# Patient Record
Sex: Male | Born: 1955 | Race: White | Hispanic: No | Marital: Married | Smoking: Former smoker
Health system: Southern US, Community
[De-identification: ages and names within clinical notes are randomized; demographics above are authoritative.]

---

## 2007-11-30 ENCOUNTER — Encounter: Admission: RE | Admit: 2007-11-30 | Discharge: 2007-11-30 | Payer: Self-pay | Admitting: Family Medicine

## 2010-04-15 ENCOUNTER — Ambulatory Visit (HOSPITAL_COMMUNITY): Admission: EM | Admit: 2010-04-15 | Discharge: 2010-04-15 | Payer: Self-pay | Admitting: *Deleted

## 2010-04-15 ENCOUNTER — Encounter: Payer: Self-pay | Admitting: Emergency Medicine

## 2010-10-09 LAB — POCT I-STAT, CHEM 8
BUN: 14 mg/dL (ref 6–23)
Creatinine, Ser: 1.2 mg/dL (ref 0.4–1.5)
Glucose, Bld: 130 mg/dL — ABNORMAL HIGH (ref 70–99)
Potassium: 4.3 mEq/L (ref 3.5–5.1)
Sodium: 139 mEq/L (ref 135–145)
TCO2: 19 mmol/L (ref 0–100)

## 2010-10-09 LAB — PROTIME-INR
INR: 1.04 (ref 0.00–1.49)
Prothrombin Time: 13.8 seconds (ref 11.6–15.2)

## 2012-05-13 IMAGING — CT CT HEAD W/O CM
3 of 4 series · 17 of 40 positions shown, 20 images · non-contrast
Comparison: None

CLINICAL DATA: Trauma.

CT HEAD WITHOUT CONTRAST
TECHNIQUE: Contiguous axial images were obtained from the base of
the skull through the vertex without contrast.

[Series 3: head_seq 4.5 h37s st · axial · 0.43mm/px · z∈[-104,-23]mm · 3 of 36 slices shown]
[im 9/36  brain]
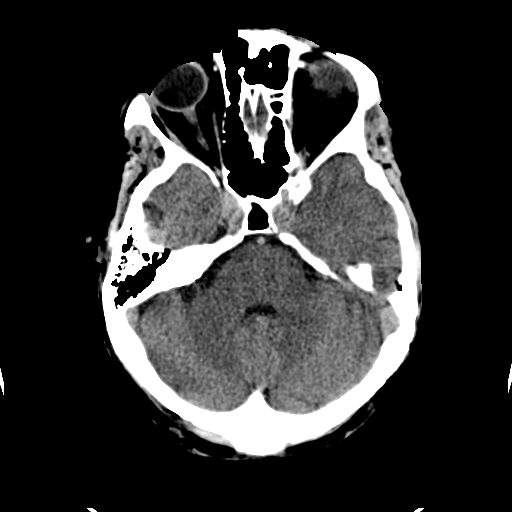
[im 18/36  brain]
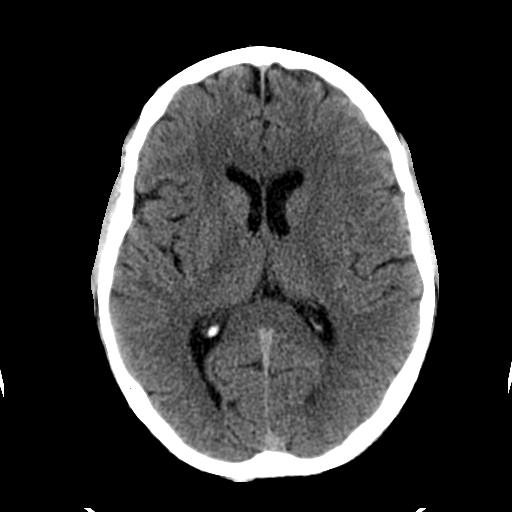
[im 27/36  brain]
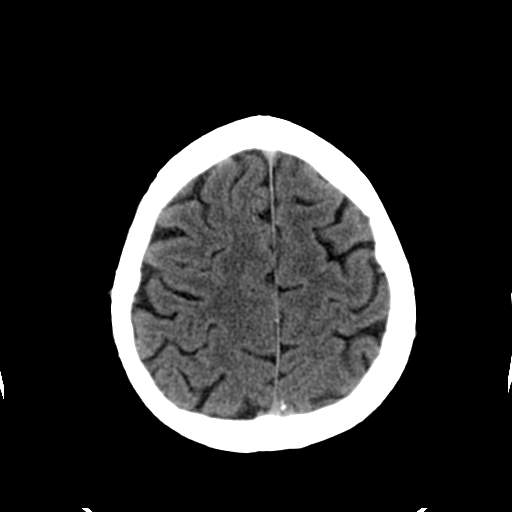

[Series 602: <mpr thick range> · coronal · 0.40mm/px · 3 of 49 slices shown]
[im 17/49  brain]
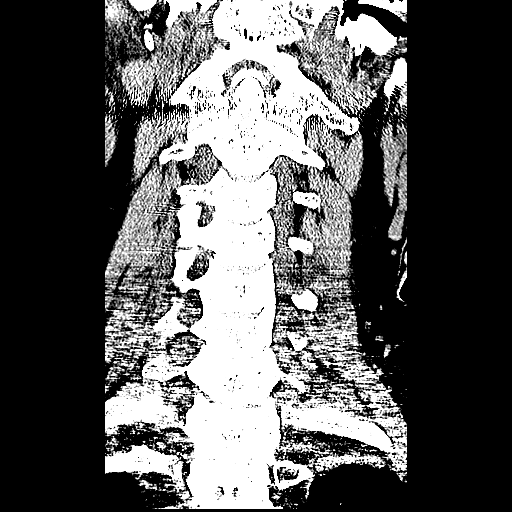
[im 22/49  brain]
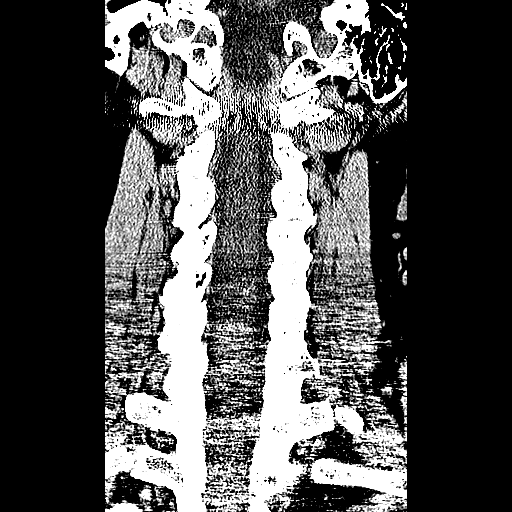
[im 27/49  brain]
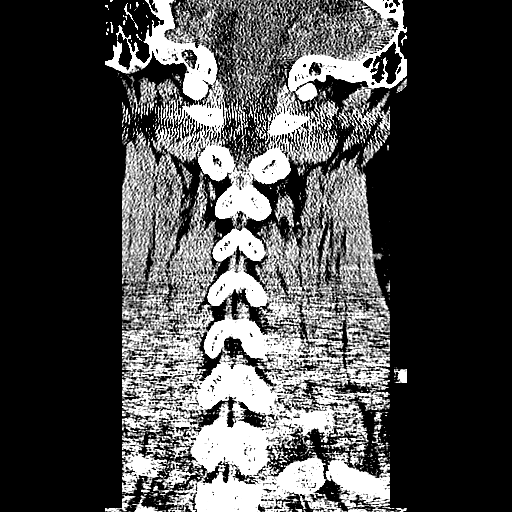

[Series 603: <mpr thick range(1)> · axial · 0.40mm/px · z∈[-349,-179]mm · 11 of 108 slices shown, 14 images]
[im 9/108  brain]
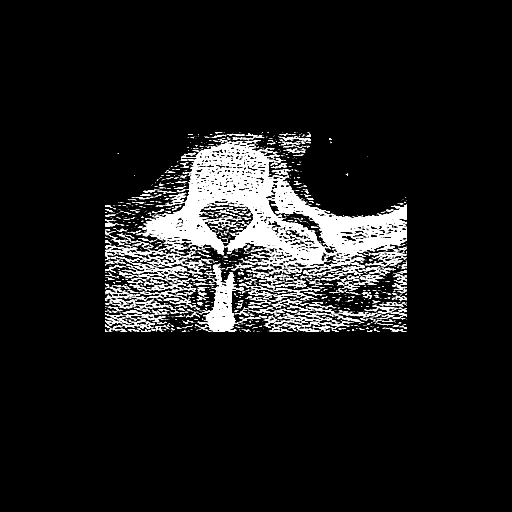
[im 9/108  bone]
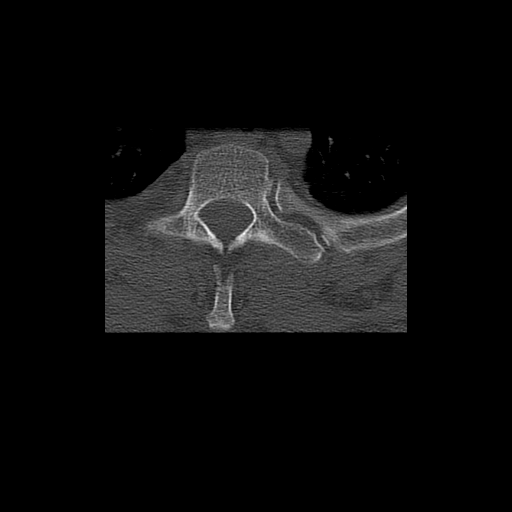
[im 18/108  brain]
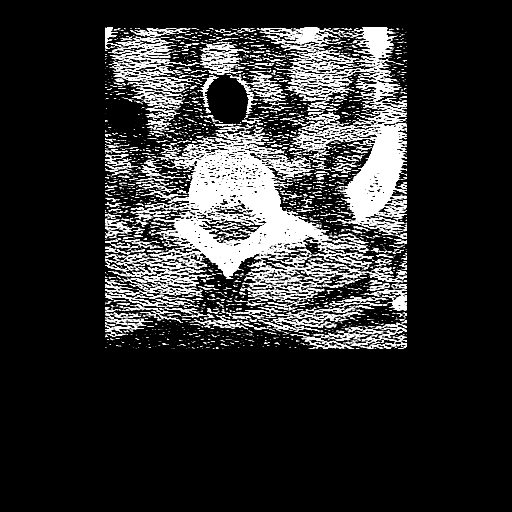
[im 27/108  brain]
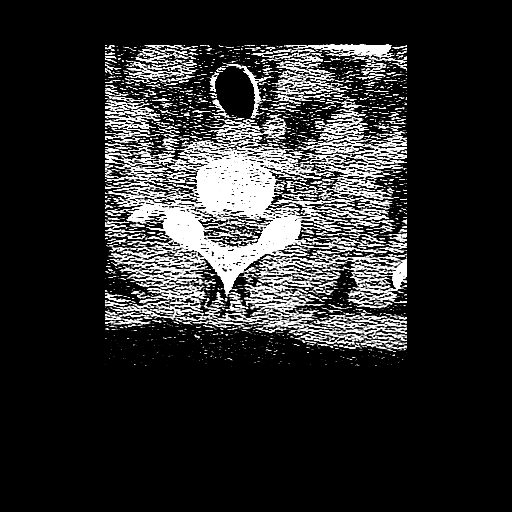
[im 36/108  brain]
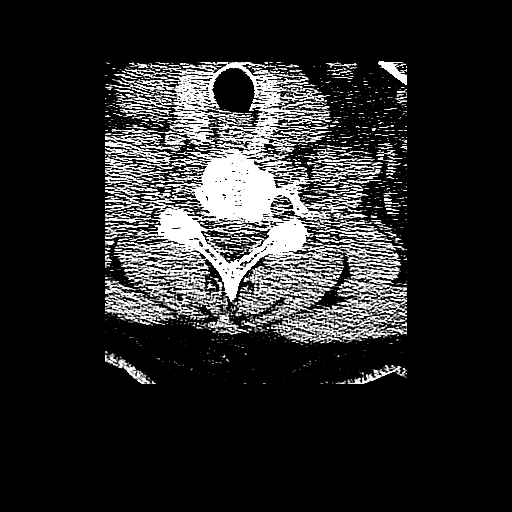
[im 45/108  brain]
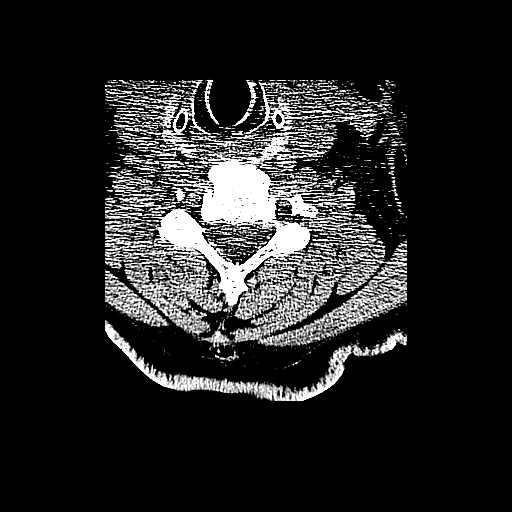
[im 45/108  bone]
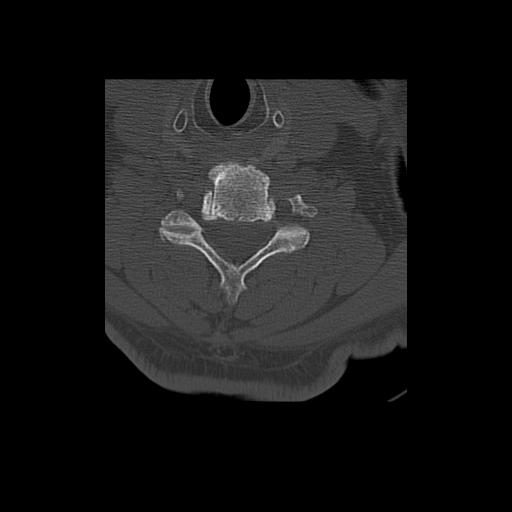
[im 54/108  brain]
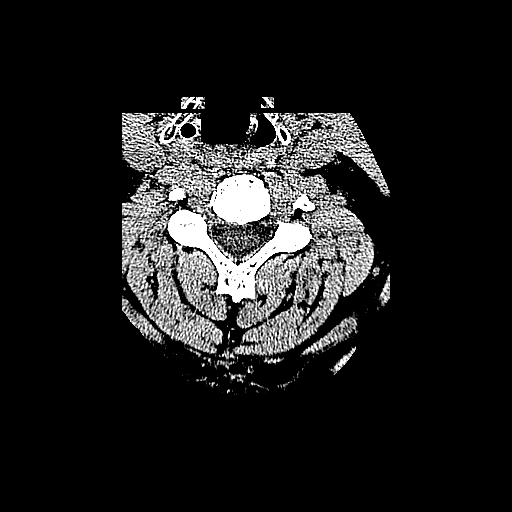
[im 63/108  brain]
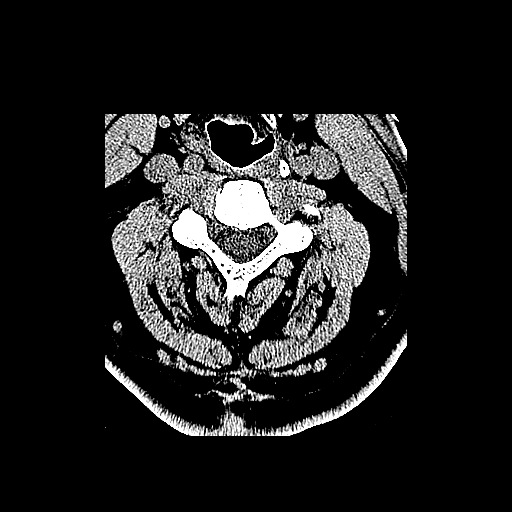
[im 72/108  brain]
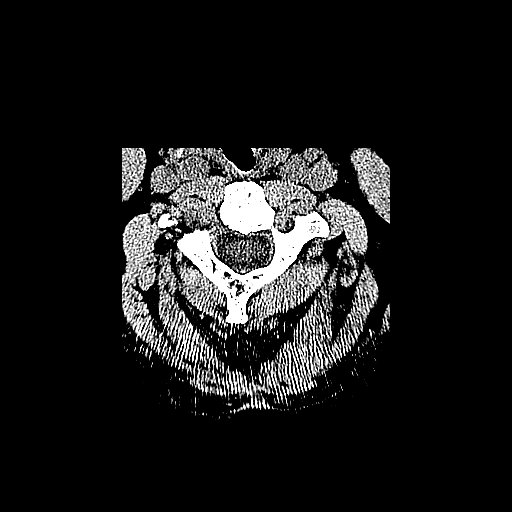
[im 81/108  brain]
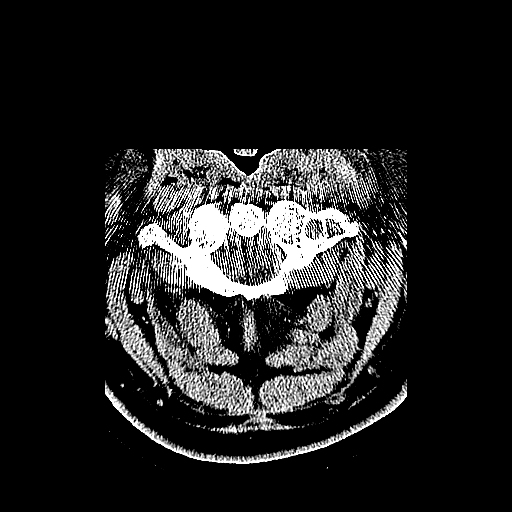
[im 81/108  bone]
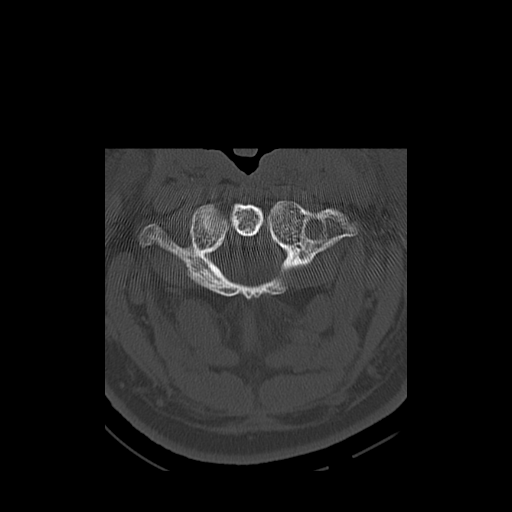
[im 90/108  brain]
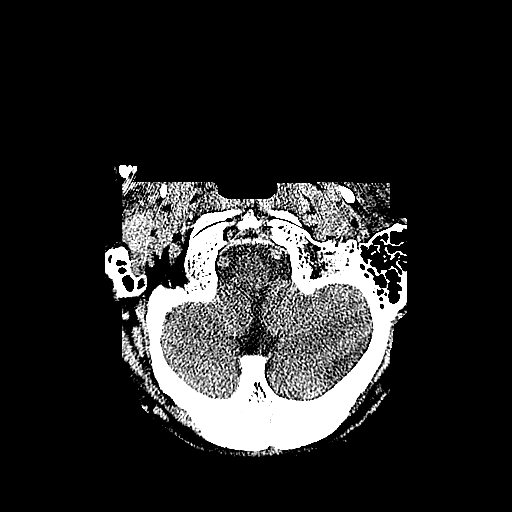
[im 99/108  brain]
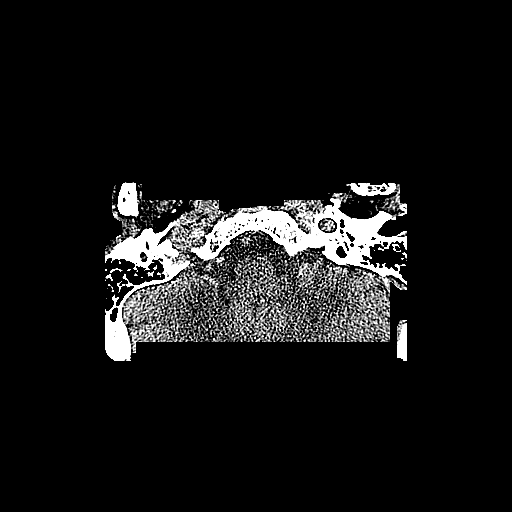

[17 of 40 positions shown; findings below may reference images not displayed]

FINDINGS: The ventricles are normal.  No extra-axial fluid
collections are seen.  The brainstem and cerebellum are
unremarkable.  No acute intracranial findings such as infarction or
hemorrhage.  No mass lesions.

The bony calvarium is intact.  The visualized paranasal sinuses and
mastoid air cells are clear.
IMPRESSION: No acute intracranial findings or skull fracture.

## 2012-05-13 IMAGING — CR DG FOREARM 2V*L*
2 series · 2 of 2 positions shown · non-contrast
Comparison: None

CLINICAL DATA: Fall.  Obvious deformity.  Pain.

LEFT FOREARM - 2 VIEW

[view not recorded (1 of 2)]
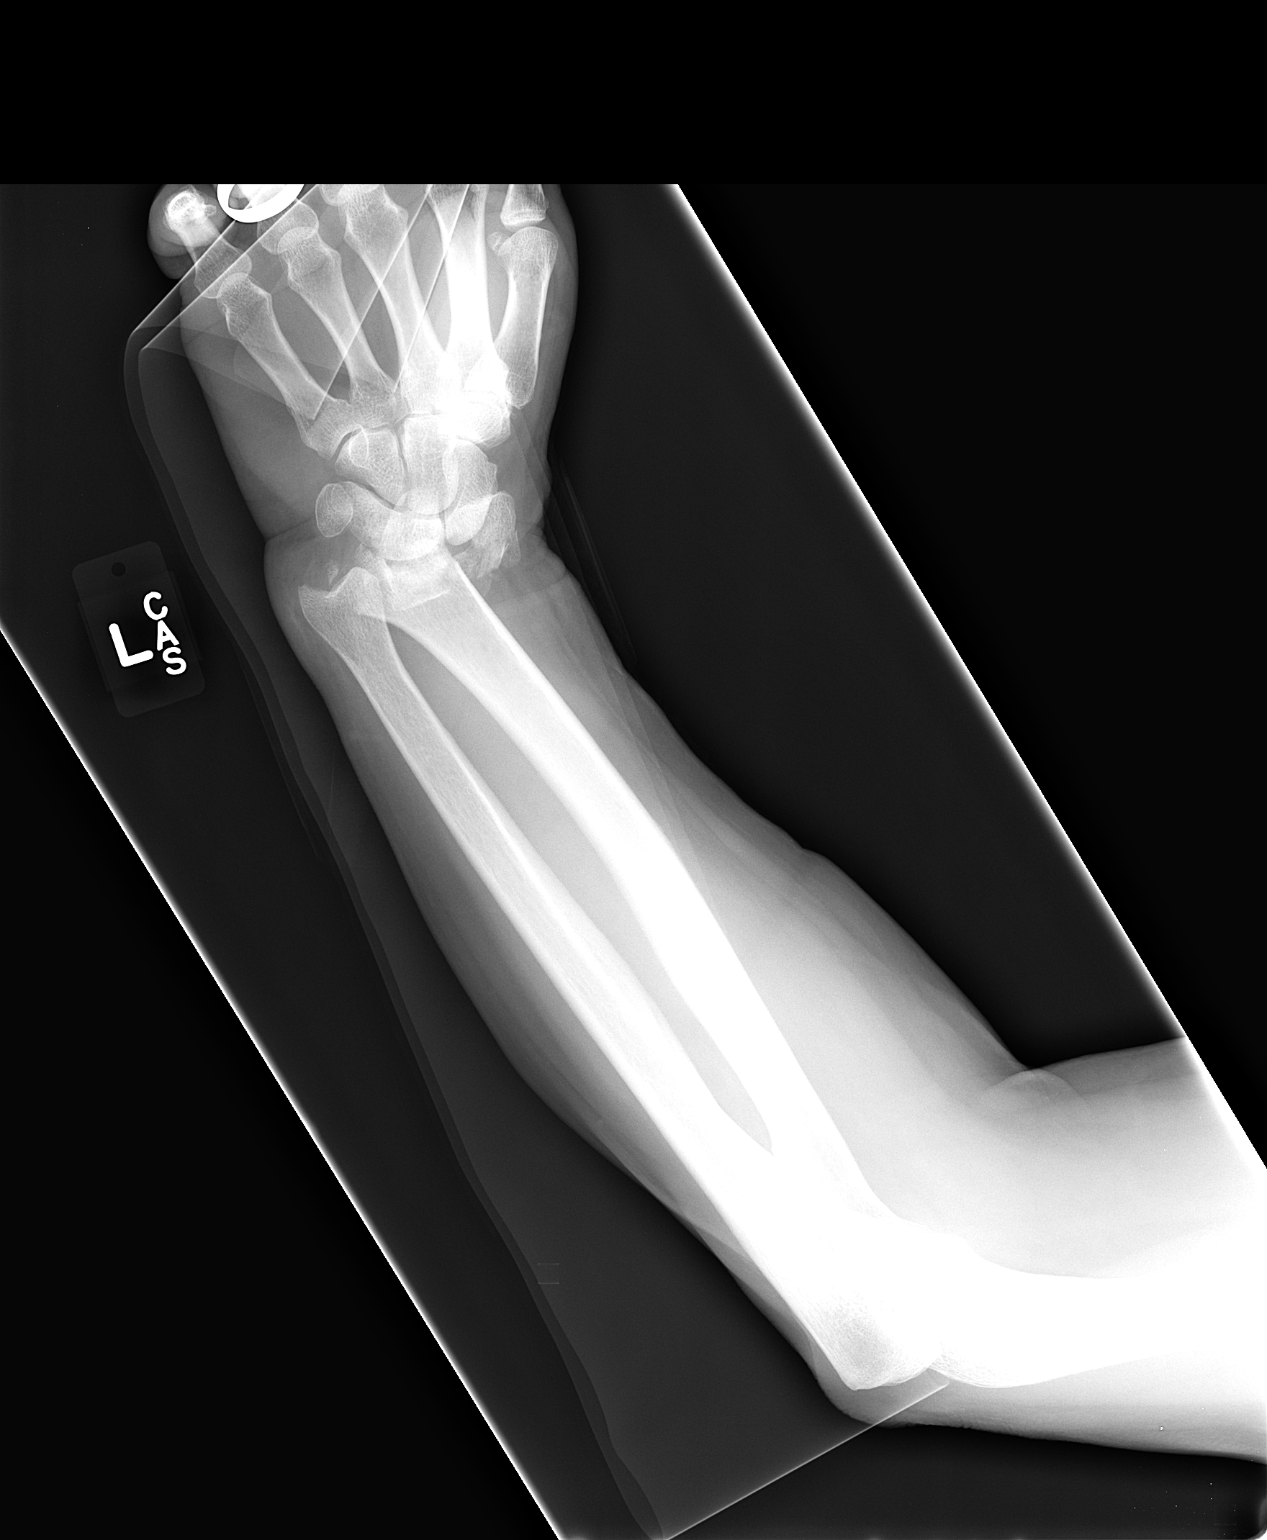

[view not recorded (2 of 2)]
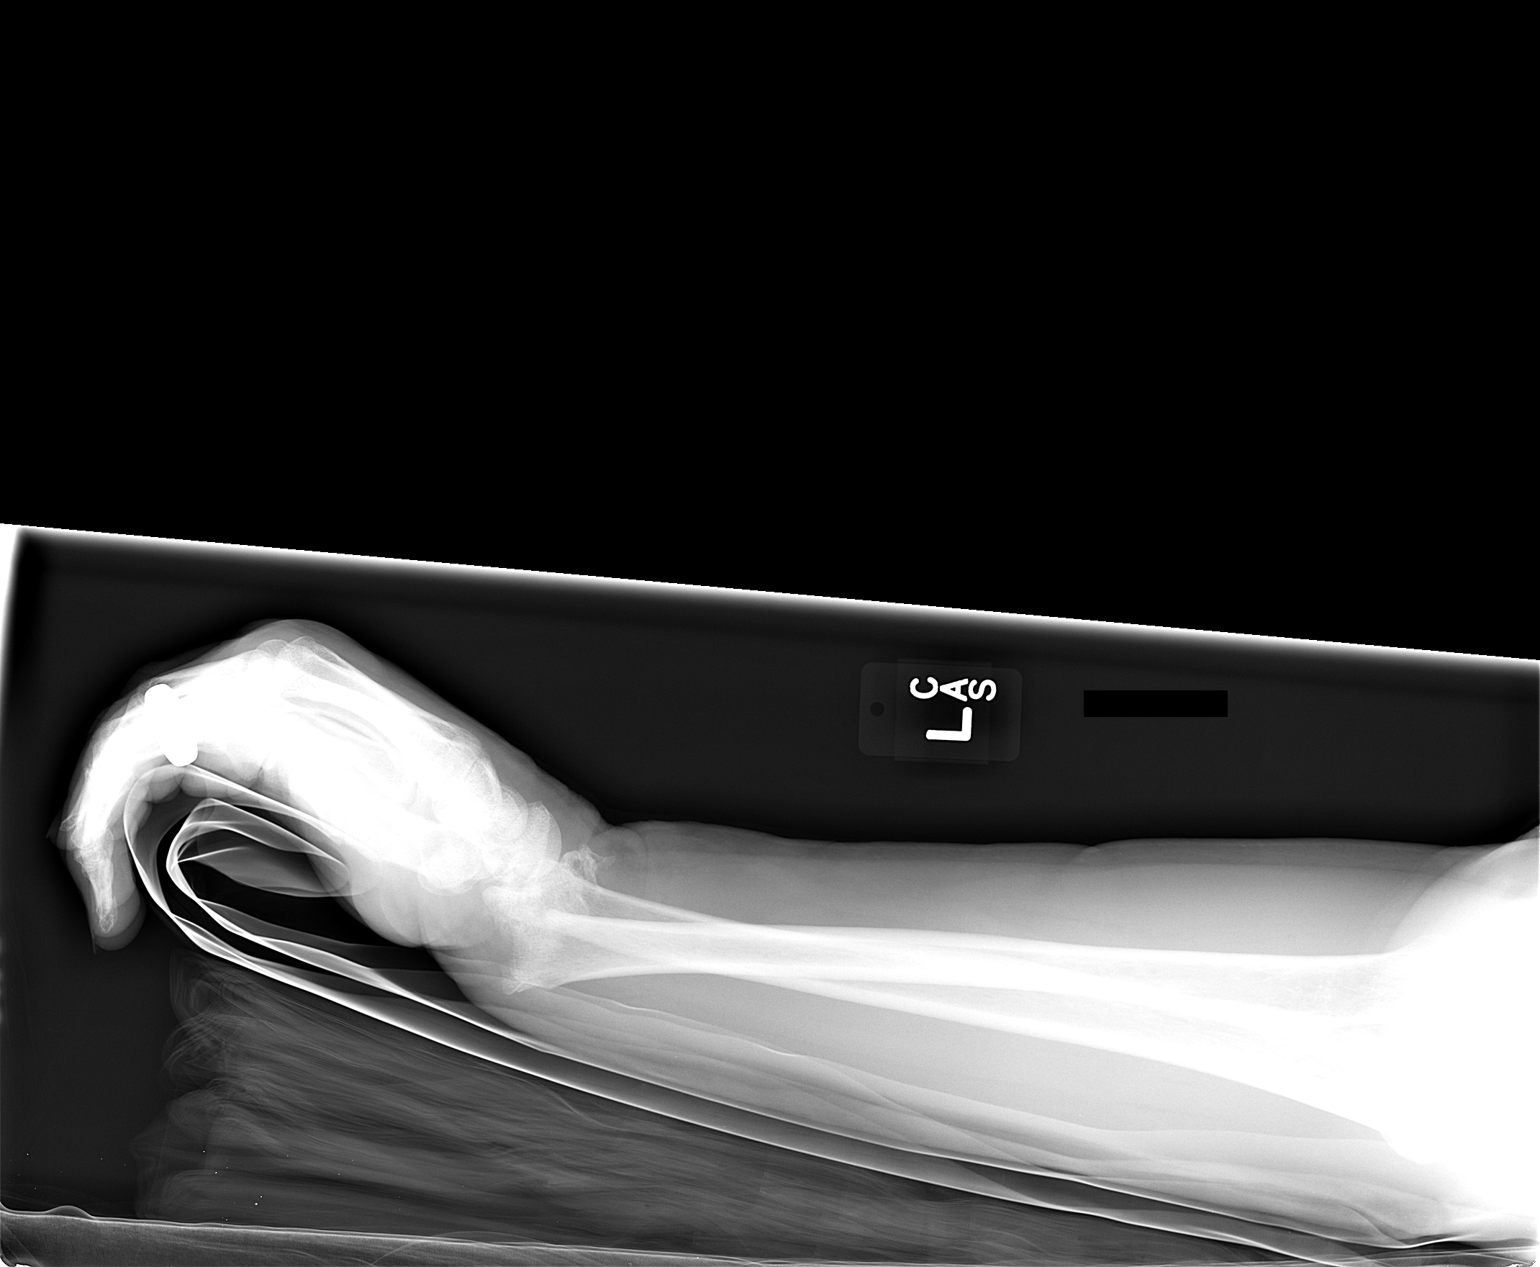

[2 of 2 positions shown; findings below may reference images not displayed]

FINDINGS: Two views are performed, showing comminuted intra-
articular fracture of the distal radius.  There is dorsal and
radial displacement and angulation at the fracture site.  There is
a fracture of the ulnar styloid.  There is probable dislocation of
the radiocarpal joint.  Limited evaluation of the carpus normal
intercarpal spaces.  There is soft tissue edema.  No radiopaque
foreign body or soft tissue gas identified.
IMPRESSION: Comminuted fracture dislocation of the radius and ulna.

The findings were discussed with Dr. Camargo on 04/15/2010 at [DATE]
p.m.

## 2014-11-01 ENCOUNTER — Ambulatory Visit
Admission: RE | Admit: 2014-11-01 | Discharge: 2014-11-01 | Disposition: A | Discharge status: Home / Self Care | Payer: Federal, State, Local not specified - PPO | Source: Ambulatory Visit | Attending: Family Medicine | Admitting: Family Medicine

## 2014-11-01 ENCOUNTER — Other Ambulatory Visit: Payer: Self-pay | Admitting: Family Medicine

## 2014-11-01 DIAGNOSIS — M25552 Pain in left hip: Secondary | ICD-10-CM

## 2015-11-07 DIAGNOSIS — F329 Major depressive disorder, single episode, unspecified: Secondary | ICD-10-CM | POA: Diagnosis not present

## 2015-11-07 DIAGNOSIS — E7800 Pure hypercholesterolemia, unspecified: Secondary | ICD-10-CM | POA: Diagnosis not present

## 2015-11-07 DIAGNOSIS — E669 Obesity, unspecified: Secondary | ICD-10-CM | POA: Diagnosis not present

## 2015-11-07 DIAGNOSIS — I10 Essential (primary) hypertension: Secondary | ICD-10-CM | POA: Diagnosis not present

## 2016-05-13 DIAGNOSIS — Z0000 Encounter for general adult medical examination without abnormal findings: Secondary | ICD-10-CM | POA: Diagnosis not present

## 2016-05-13 DIAGNOSIS — Z125 Encounter for screening for malignant neoplasm of prostate: Secondary | ICD-10-CM | POA: Diagnosis not present

## 2016-05-13 DIAGNOSIS — Z1159 Encounter for screening for other viral diseases: Secondary | ICD-10-CM | POA: Diagnosis not present

## 2016-05-13 DIAGNOSIS — Z23 Encounter for immunization: Secondary | ICD-10-CM | POA: Diagnosis not present

## 2016-05-13 DIAGNOSIS — I10 Essential (primary) hypertension: Secondary | ICD-10-CM | POA: Diagnosis not present

## 2016-05-13 DIAGNOSIS — E7800 Pure hypercholesterolemia, unspecified: Secondary | ICD-10-CM | POA: Diagnosis not present

## 2016-09-02 DIAGNOSIS — K080 Exfoliation of teeth due to systemic causes: Secondary | ICD-10-CM | POA: Diagnosis not present

## 2016-11-11 DIAGNOSIS — E7800 Pure hypercholesterolemia, unspecified: Secondary | ICD-10-CM | POA: Diagnosis not present

## 2016-11-11 DIAGNOSIS — I10 Essential (primary) hypertension: Secondary | ICD-10-CM | POA: Diagnosis not present

## 2016-11-11 DIAGNOSIS — F329 Major depressive disorder, single episode, unspecified: Secondary | ICD-10-CM | POA: Diagnosis not present

## 2016-11-29 IMAGING — CR DG HIP (WITH OR WITHOUT PELVIS) 2-3V*L*
2 series · 2 of 2 positions shown · non-contrast
Comparison: Pelvic radiograph 04/15/2010.

CLINICAL DATA: Left hip pain with standing for 2 months. No known
injury. Initial encounter.

EXAM:
LEFT HIP (WITH PELVIS) 2-3 VIEWS

[w pelvis]
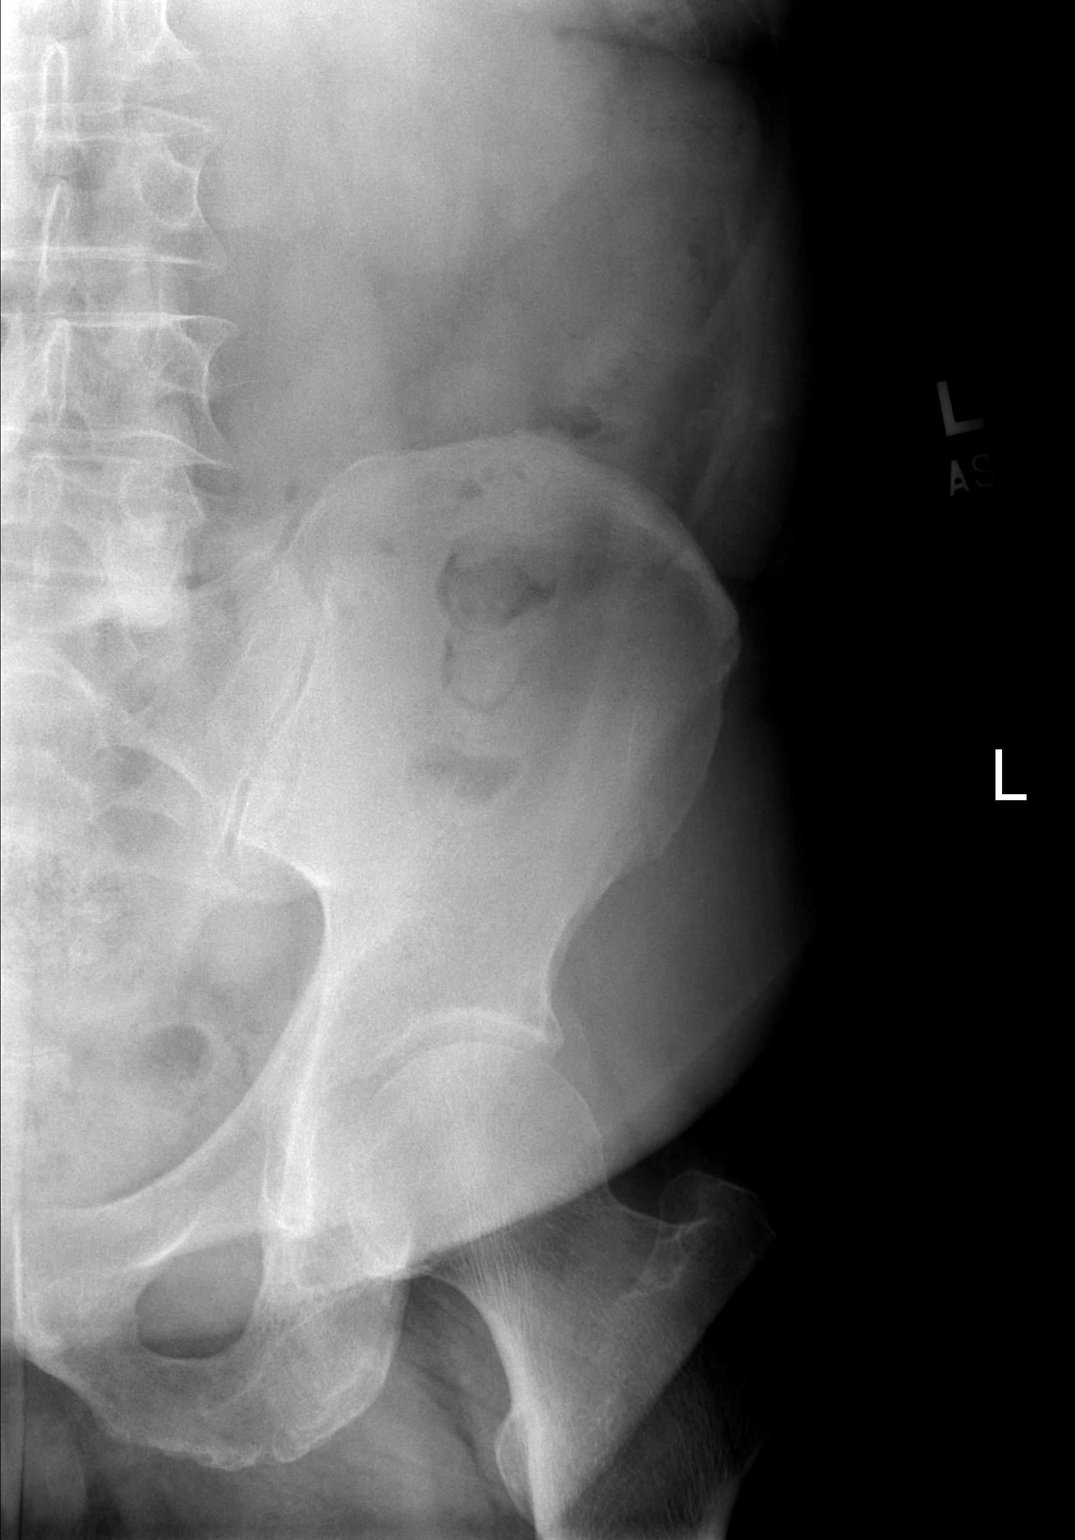

[w pelvis *]
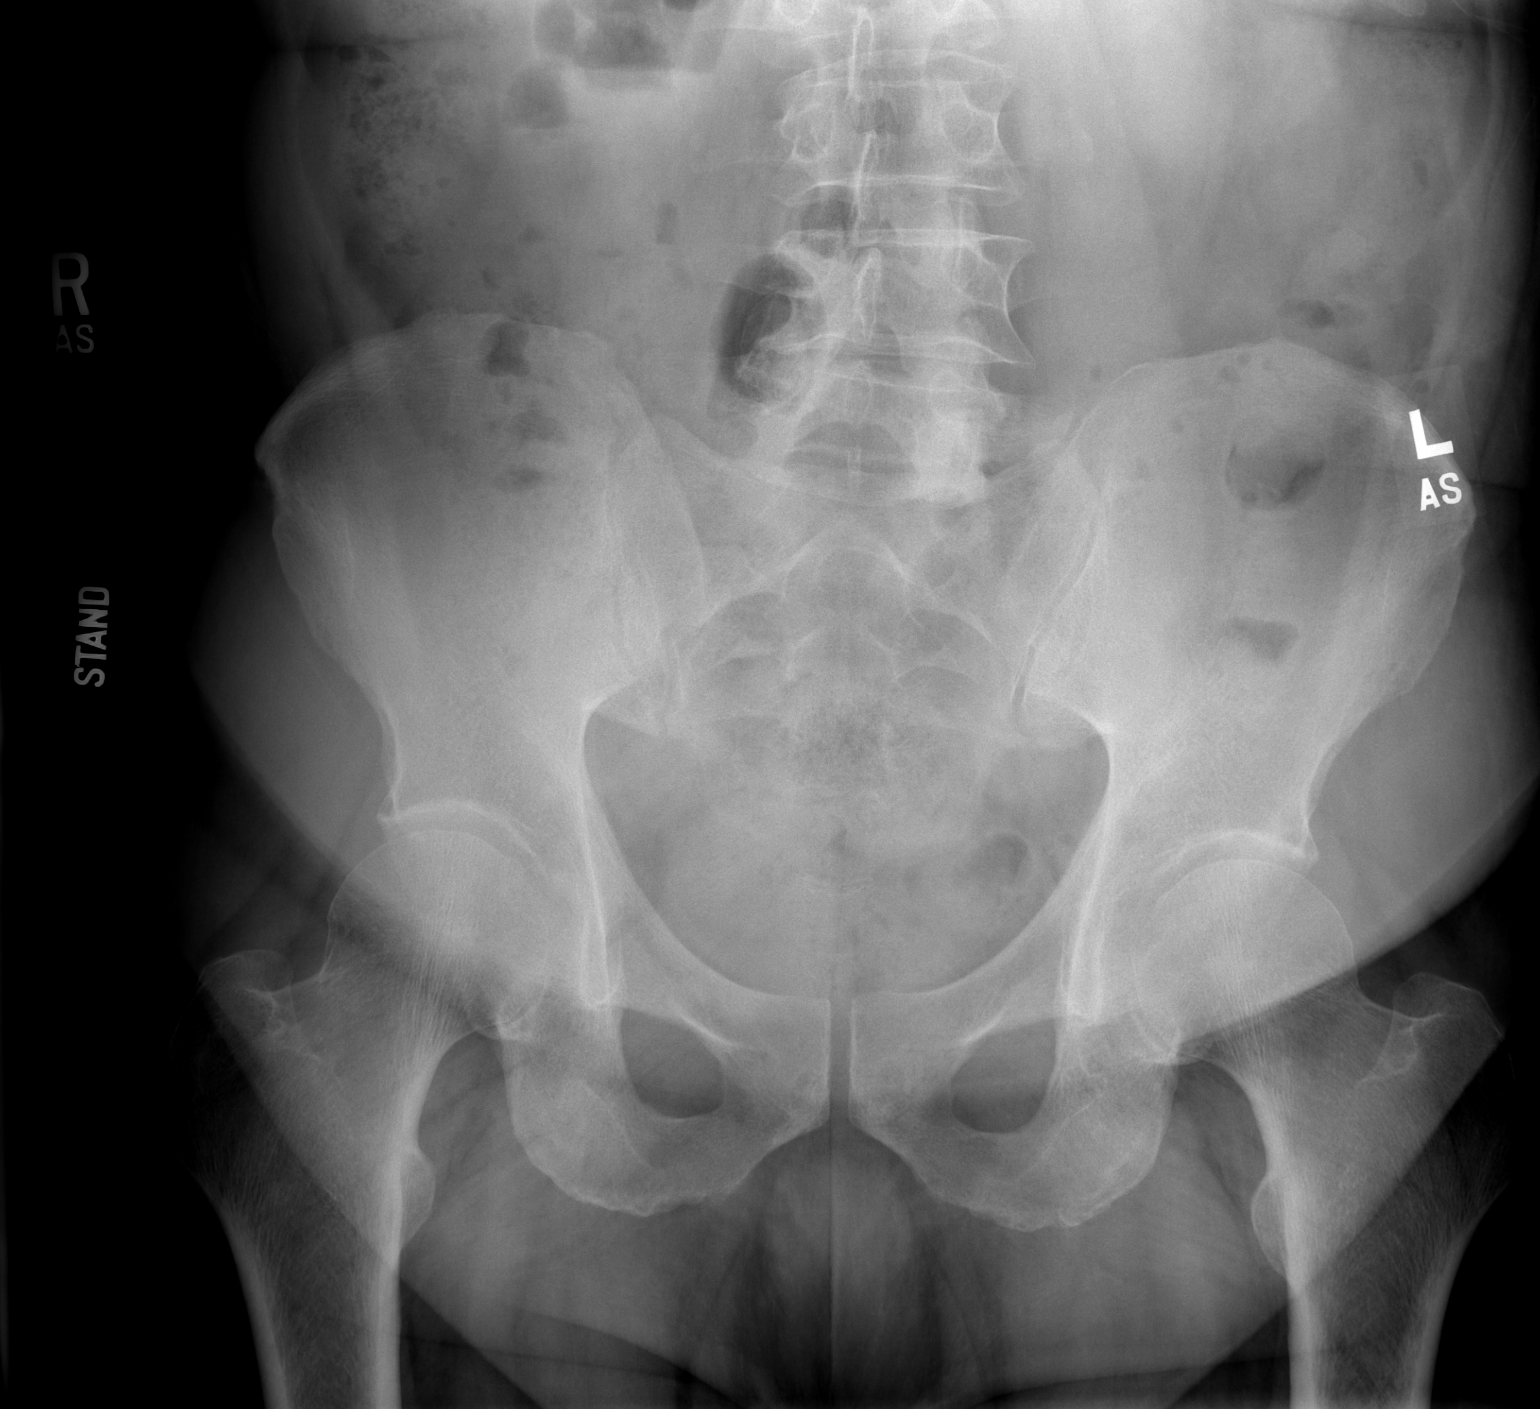

[2 of 2 positions shown; findings below may reference images not displayed]

FINDINGS: The mineralization and alignment are normal. There is no evidence of
acute fracture or dislocation. There is no evidence of femoral head
avascular necrosis. The hip joint spaces are preserved. The
sacroiliac joints appear normal. There is mild disc space loss in
the lower lumbar spine.
IMPRESSION: No acute findings or significant arthropathic changes in the pelvis
or hips. Lower lumbar spondylosis.

## 2017-03-10 DIAGNOSIS — K080 Exfoliation of teeth due to systemic causes: Secondary | ICD-10-CM | POA: Diagnosis not present

## 2017-05-20 DIAGNOSIS — I10 Essential (primary) hypertension: Secondary | ICD-10-CM | POA: Diagnosis not present

## 2017-05-20 DIAGNOSIS — Z0000 Encounter for general adult medical examination without abnormal findings: Secondary | ICD-10-CM | POA: Diagnosis not present

## 2017-05-20 DIAGNOSIS — E7800 Pure hypercholesterolemia, unspecified: Secondary | ICD-10-CM | POA: Diagnosis not present

## 2017-05-20 DIAGNOSIS — F329 Major depressive disorder, single episode, unspecified: Secondary | ICD-10-CM | POA: Diagnosis not present

## 2017-05-20 DIAGNOSIS — Z23 Encounter for immunization: Secondary | ICD-10-CM | POA: Diagnosis not present

## 2017-07-06 DIAGNOSIS — Z0189 Encounter for other specified special examinations: Secondary | ICD-10-CM | POA: Diagnosis not present

## 2017-07-06 DIAGNOSIS — I10 Essential (primary) hypertension: Secondary | ICD-10-CM | POA: Diagnosis not present

## 2017-09-16 DIAGNOSIS — K080 Exfoliation of teeth due to systemic causes: Secondary | ICD-10-CM | POA: Diagnosis not present

## 2017-12-02 DIAGNOSIS — I10 Essential (primary) hypertension: Secondary | ICD-10-CM | POA: Diagnosis not present

## 2017-12-02 DIAGNOSIS — F329 Major depressive disorder, single episode, unspecified: Secondary | ICD-10-CM | POA: Diagnosis not present

## 2017-12-02 DIAGNOSIS — E7800 Pure hypercholesterolemia, unspecified: Secondary | ICD-10-CM | POA: Diagnosis not present

## 2018-05-17 DIAGNOSIS — K080 Exfoliation of teeth due to systemic causes: Secondary | ICD-10-CM | POA: Diagnosis not present

## 2018-06-01 DIAGNOSIS — F329 Major depressive disorder, single episode, unspecified: Secondary | ICD-10-CM | POA: Diagnosis not present

## 2018-06-01 DIAGNOSIS — Z23 Encounter for immunization: Secondary | ICD-10-CM | POA: Diagnosis not present

## 2018-06-01 DIAGNOSIS — E7800 Pure hypercholesterolemia, unspecified: Secondary | ICD-10-CM | POA: Diagnosis not present

## 2018-06-01 DIAGNOSIS — Z125 Encounter for screening for malignant neoplasm of prostate: Secondary | ICD-10-CM | POA: Diagnosis not present

## 2018-06-01 DIAGNOSIS — Z0000 Encounter for general adult medical examination without abnormal findings: Secondary | ICD-10-CM | POA: Diagnosis not present

## 2018-06-01 DIAGNOSIS — I10 Essential (primary) hypertension: Secondary | ICD-10-CM | POA: Diagnosis not present

## 2018-07-06 DIAGNOSIS — M545 Low back pain: Secondary | ICD-10-CM | POA: Diagnosis not present

## 2018-07-06 DIAGNOSIS — I10 Essential (primary) hypertension: Secondary | ICD-10-CM | POA: Diagnosis not present

## 2018-07-06 DIAGNOSIS — G8929 Other chronic pain: Secondary | ICD-10-CM | POA: Diagnosis not present

## 2018-07-06 DIAGNOSIS — Z0000 Encounter for general adult medical examination without abnormal findings: Secondary | ICD-10-CM | POA: Diagnosis not present

## 2018-07-07 DIAGNOSIS — I10 Essential (primary) hypertension: Secondary | ICD-10-CM | POA: Diagnosis not present

## 2018-07-07 DIAGNOSIS — M25511 Pain in right shoulder: Secondary | ICD-10-CM | POA: Diagnosis not present

## 2018-08-25 DIAGNOSIS — Z23 Encounter for immunization: Secondary | ICD-10-CM | POA: Diagnosis not present

## 2018-09-14 DIAGNOSIS — M25511 Pain in right shoulder: Secondary | ICD-10-CM | POA: Diagnosis not present

## 2018-12-13 DIAGNOSIS — I10 Essential (primary) hypertension: Secondary | ICD-10-CM | POA: Diagnosis not present

## 2018-12-13 DIAGNOSIS — E7800 Pure hypercholesterolemia, unspecified: Secondary | ICD-10-CM | POA: Diagnosis not present

## 2018-12-13 DIAGNOSIS — E669 Obesity, unspecified: Secondary | ICD-10-CM | POA: Diagnosis not present

## 2018-12-13 DIAGNOSIS — F329 Major depressive disorder, single episode, unspecified: Secondary | ICD-10-CM | POA: Diagnosis not present

## 2019-01-12 DIAGNOSIS — K080 Exfoliation of teeth due to systemic causes: Secondary | ICD-10-CM | POA: Diagnosis not present

## 2019-02-22 DIAGNOSIS — K080 Exfoliation of teeth due to systemic causes: Secondary | ICD-10-CM | POA: Diagnosis not present

## 2019-03-02 DIAGNOSIS — K080 Exfoliation of teeth due to systemic causes: Secondary | ICD-10-CM | POA: Diagnosis not present

## 2019-03-14 DIAGNOSIS — K080 Exfoliation of teeth due to systemic causes: Secondary | ICD-10-CM | POA: Diagnosis not present

## 2019-05-08 DIAGNOSIS — K080 Exfoliation of teeth due to systemic causes: Secondary | ICD-10-CM | POA: Diagnosis not present

## 2019-06-02 DIAGNOSIS — Z23 Encounter for immunization: Secondary | ICD-10-CM | POA: Diagnosis not present

## 2019-06-28 DIAGNOSIS — E7800 Pure hypercholesterolemia, unspecified: Secondary | ICD-10-CM | POA: Diagnosis not present

## 2019-06-28 DIAGNOSIS — F329 Major depressive disorder, single episode, unspecified: Secondary | ICD-10-CM | POA: Diagnosis not present

## 2019-06-28 DIAGNOSIS — Z0000 Encounter for general adult medical examination without abnormal findings: Secondary | ICD-10-CM | POA: Diagnosis not present

## 2019-06-28 DIAGNOSIS — I10 Essential (primary) hypertension: Secondary | ICD-10-CM | POA: Diagnosis not present

## 2019-07-06 DIAGNOSIS — M545 Low back pain: Secondary | ICD-10-CM | POA: Diagnosis not present

## 2019-07-06 DIAGNOSIS — M25511 Pain in right shoulder: Secondary | ICD-10-CM | POA: Diagnosis not present

## 2019-07-06 DIAGNOSIS — I10 Essential (primary) hypertension: Secondary | ICD-10-CM | POA: Diagnosis not present

## 2019-07-06 DIAGNOSIS — E7800 Pure hypercholesterolemia, unspecified: Secondary | ICD-10-CM | POA: Diagnosis not present

## 2019-07-06 DIAGNOSIS — L989 Disorder of the skin and subcutaneous tissue, unspecified: Secondary | ICD-10-CM | POA: Diagnosis not present

## 2019-08-10 DIAGNOSIS — D229 Melanocytic nevi, unspecified: Secondary | ICD-10-CM | POA: Diagnosis not present

## 2019-08-10 DIAGNOSIS — L821 Other seborrheic keratosis: Secondary | ICD-10-CM | POA: Diagnosis not present

## 2019-08-10 DIAGNOSIS — L570 Actinic keratosis: Secondary | ICD-10-CM | POA: Diagnosis not present

## 2019-10-05 DIAGNOSIS — Z23 Encounter for immunization: Secondary | ICD-10-CM | POA: Diagnosis not present

## 2019-10-06 DIAGNOSIS — S51851A Open bite of right forearm, initial encounter: Secondary | ICD-10-CM | POA: Diagnosis not present

## 2019-10-06 DIAGNOSIS — W540XXA Bitten by dog, initial encounter: Secondary | ICD-10-CM | POA: Diagnosis not present

## 2020-01-05 DIAGNOSIS — I10 Essential (primary) hypertension: Secondary | ICD-10-CM | POA: Diagnosis not present

## 2020-01-05 DIAGNOSIS — E7800 Pure hypercholesterolemia, unspecified: Secondary | ICD-10-CM | POA: Diagnosis not present

## 2020-01-05 DIAGNOSIS — F329 Major depressive disorder, single episode, unspecified: Secondary | ICD-10-CM | POA: Diagnosis not present

## 2020-07-04 DIAGNOSIS — G8929 Other chronic pain: Secondary | ICD-10-CM | POA: Diagnosis not present

## 2020-07-04 DIAGNOSIS — M545 Low back pain, unspecified: Secondary | ICD-10-CM | POA: Diagnosis not present

## 2020-07-04 DIAGNOSIS — I10 Essential (primary) hypertension: Secondary | ICD-10-CM | POA: Diagnosis not present

## 2020-07-05 DIAGNOSIS — I10 Essential (primary) hypertension: Secondary | ICD-10-CM | POA: Diagnosis not present

## 2020-07-05 DIAGNOSIS — Z0000 Encounter for general adult medical examination without abnormal findings: Secondary | ICD-10-CM | POA: Diagnosis not present

## 2020-07-05 DIAGNOSIS — Z125 Encounter for screening for malignant neoplasm of prostate: Secondary | ICD-10-CM | POA: Diagnosis not present

## 2020-07-05 DIAGNOSIS — E7800 Pure hypercholesterolemia, unspecified: Secondary | ICD-10-CM | POA: Diagnosis not present

## 2020-07-05 DIAGNOSIS — F329 Major depressive disorder, single episode, unspecified: Secondary | ICD-10-CM | POA: Diagnosis not present

## 2020-08-09 DIAGNOSIS — Z872 Personal history of diseases of the skin and subcutaneous tissue: Secondary | ICD-10-CM | POA: Diagnosis not present

## 2020-08-09 DIAGNOSIS — D225 Melanocytic nevi of trunk: Secondary | ICD-10-CM | POA: Diagnosis not present

## 2020-08-09 DIAGNOSIS — L821 Other seborrheic keratosis: Secondary | ICD-10-CM | POA: Diagnosis not present

## 2020-08-09 DIAGNOSIS — Z09 Encounter for follow-up examination after completed treatment for conditions other than malignant neoplasm: Secondary | ICD-10-CM | POA: Diagnosis not present

## 2021-03-20 DIAGNOSIS — Z23 Encounter for immunization: Secondary | ICD-10-CM | POA: Diagnosis not present

## 2021-03-20 DIAGNOSIS — E7800 Pure hypercholesterolemia, unspecified: Secondary | ICD-10-CM | POA: Diagnosis not present

## 2021-03-20 DIAGNOSIS — I10 Essential (primary) hypertension: Secondary | ICD-10-CM | POA: Diagnosis not present

## 2021-04-02 DIAGNOSIS — U071 COVID-19: Secondary | ICD-10-CM | POA: Diagnosis not present

## 2021-07-02 DIAGNOSIS — Z03818 Encounter for observation for suspected exposure to other biological agents ruled out: Secondary | ICD-10-CM | POA: Diagnosis not present

## 2021-07-02 DIAGNOSIS — J101 Influenza due to other identified influenza virus with other respiratory manifestations: Secondary | ICD-10-CM | POA: Diagnosis not present

## 2021-07-02 DIAGNOSIS — B349 Viral infection, unspecified: Secondary | ICD-10-CM | POA: Diagnosis not present

## 2021-07-10 DIAGNOSIS — Z125 Encounter for screening for malignant neoplasm of prostate: Secondary | ICD-10-CM | POA: Diagnosis not present

## 2021-07-10 DIAGNOSIS — I10 Essential (primary) hypertension: Secondary | ICD-10-CM | POA: Diagnosis not present

## 2021-07-10 DIAGNOSIS — E7800 Pure hypercholesterolemia, unspecified: Secondary | ICD-10-CM | POA: Diagnosis not present

## 2021-07-10 DIAGNOSIS — Z0000 Encounter for general adult medical examination without abnormal findings: Secondary | ICD-10-CM | POA: Diagnosis not present

## 2021-07-10 DIAGNOSIS — F329 Major depressive disorder, single episode, unspecified: Secondary | ICD-10-CM | POA: Diagnosis not present

## 2021-07-11 DIAGNOSIS — Z87891 Personal history of nicotine dependence: Secondary | ICD-10-CM | POA: Diagnosis not present

## 2021-07-11 DIAGNOSIS — I10 Essential (primary) hypertension: Secondary | ICD-10-CM | POA: Diagnosis not present

## 2021-07-11 DIAGNOSIS — Z125 Encounter for screening for malignant neoplasm of prostate: Secondary | ICD-10-CM | POA: Diagnosis not present

## 2021-07-11 DIAGNOSIS — M779 Enthesopathy, unspecified: Secondary | ICD-10-CM | POA: Diagnosis not present

## 2021-09-10 DIAGNOSIS — K080 Exfoliation of teeth due to systemic causes: Secondary | ICD-10-CM | POA: Diagnosis not present

## 2022-01-14 DIAGNOSIS — I10 Essential (primary) hypertension: Secondary | ICD-10-CM | POA: Diagnosis not present

## 2022-01-14 DIAGNOSIS — E7800 Pure hypercholesterolemia, unspecified: Secondary | ICD-10-CM | POA: Diagnosis not present

## 2022-01-14 DIAGNOSIS — E669 Obesity, unspecified: Secondary | ICD-10-CM | POA: Diagnosis not present

## 2022-01-15 ENCOUNTER — Other Ambulatory Visit: Payer: Self-pay | Admitting: Family Medicine

## 2022-01-15 ENCOUNTER — Other Ambulatory Visit (HOSPITAL_COMMUNITY): Payer: Self-pay | Admitting: Family Medicine

## 2022-01-15 DIAGNOSIS — E7800 Pure hypercholesterolemia, unspecified: Secondary | ICD-10-CM

## 2022-02-18 ENCOUNTER — Ambulatory Visit (HOSPITAL_BASED_OUTPATIENT_CLINIC_OR_DEPARTMENT_OTHER)
Admission: RE | Admit: 2022-02-18 | Discharge: 2022-02-18 | Disposition: A | Discharge status: Home / Self Care | Payer: Federal, State, Local not specified - PPO | Source: Ambulatory Visit | Attending: Family Medicine | Admitting: Family Medicine

## 2022-02-18 DIAGNOSIS — E7800 Pure hypercholesterolemia, unspecified: Secondary | ICD-10-CM | POA: Insufficient documentation

## 2022-04-16 ENCOUNTER — Encounter: Payer: Self-pay | Admitting: Interventional Cardiology

## 2022-04-16 ENCOUNTER — Ambulatory Visit
Payer: Federal, State, Local not specified - PPO | Attending: Interventional Cardiology | Admitting: Interventional Cardiology

## 2022-04-16 VITALS — BP 112/80 | HR 65 | Ht 70.0 in | Wt 213.0 lb

## 2022-04-16 DIAGNOSIS — I10 Essential (primary) hypertension: Secondary | ICD-10-CM | POA: Diagnosis not present

## 2022-04-16 DIAGNOSIS — E785 Hyperlipidemia, unspecified: Secondary | ICD-10-CM | POA: Diagnosis not present

## 2022-04-16 NOTE — Progress Notes (Signed)
Cardiology Office Note   Date:  04/16/2022   ID:  Ricky Brown, DOB [birth year], MRN [id number]  PCP:  Glenis Smoker, MD    No chief complaint on file.  Coronary artery calcification  Wt Readings from Last 3 Encounters:  04/16/22 213 lb (96.6 kg)       History of Present Illness: Ricky Brown is a 66 y.o. male who is being seen today for the evaluation of coronary artery calcification at the request of Glenis Smoker, *.   Calcium scoring CT in July 2023 showed: "Coronary Calcium Score:   Left main: 0   Left anterior descending artery: 259   Left circumflex artery: 0   Right coronary artery: 0   Total: 259   Percentile: 71   Pericardium: Normal.   Ascending Aorta: Normal caliber."  He walks and hikes.  He wants to avoid statins.   Has felt well doing strenuous hikes.    Denies : Chest pain. Dizziness. Leg edema. Nitroglycerin use. Orthopnea. Palpitations. Paroxysmal nocturnal dyspnea. Shortness of breath. Syncope.    No past medical history on file.     Current Outpatient Medications  Medication Sig Dispense Refill   amLODipine (NORVASC) 10 MG tablet Take 0.5 tablets by mouth daily.     citalopram (CELEXA) 40 MG tablet Take 40 mg by mouth daily.     lisinopril (ZESTRIL) 20 MG tablet Take 20 mg by mouth daily.     No current facility-administered medications for this visit.    Allergies:   Patient has no allergy information on record.    Social History:  The patient  reports that he has quit smoking. His smoking use included cigarettes. He has never used smokeless tobacco. He reports current alcohol use. He reports that he does not use drugs.   Family History:  The patient's family history includes Heart disease in his father.    ROS:  Please see the history of present illness.   Otherwise, review of systems are positive for hesistant to take statins.   All other systems are reviewed and negative.    PHYSICAL EXAM: VS:  BP 112/80    Pulse 65   Ht 5\' 10"  (1.778 m)   Wt 213 lb (96.6 kg)   SpO2 98%   BMI 30.56 kg/m  , BMI Body mass index is 30.56 kg/m. GEN: Well nourished, well developed, in no acute distress HEENT: normal Neck: no JVD, carotid bruits, or masses Cardiac: RRR; no murmurs, rubs, or gallops,no edema  Respiratory:  clear to auscultation bilaterally, normal work of breathing GI: soft, nontender, nondistended, + BS MS: no deformity or atrophy Skin: warm and dry, no rash Neuro:  Strength and sensation are intact Psych: euthymic mood, full affect   EKG:   The ekg ordered today demonstrates NSR, no ST changes   Recent Labs: No results found for requested labs within last 365 days.   Lipid Panel No results found for: "CHOL", "TRIG", "HDL", "CHOLHDL", "VLDL", "LDLCALC", "LDLDIRECT"   Other studies Reviewed: Additional studies/ records that were reviewed today with results demonstrating: calcium score reviewed.   ASSESSMENT AND PLAN:  Coronary artery calcification: Preventive therapy recommended.  Whole food, plant based diet.  No again.   Hyperlipidemia: trying to avoid statins. Whole food plant-based diet.  high-fiber diet.  Avoid processed foods.  I recommended statin.  He prefers to hold off.  He wants to see how the future lipids values.  Explained the anti-inflammatory effect which would hopefully prevent  atherosclerosis from developing or worsening in blood vessels. Family h/o CAD: Father with CABG.  HTN: The current medical regimen is effective;  continue present plan and medications.   Current medicines are reviewed at length with the patient today.  The patient concerns regarding his medicines were addressed.  The following changes have been made:  No change  Labs/ tests ordered today include:  No orders of the defined types were placed in this encounter.   Recommend 150 minutes/week of aerobic exercise Low fat, low carb, high fiber diet recommended  Disposition:   FU in 1  year   Signed, Larae Grooms, MD  04/16/2022 2:54 PM    [facility] [place], [place], [place]  [address] Phone: [phone]; Fax: [phone]

## 2022-04-16 NOTE — Patient Instructions (Signed)
Medication Instructions:  Your physician recommends that you continue on your current medications as directed. Please refer to the Current Medication list given to you today.  *If you need a refill on your cardiac medications before your next appointment, please call your pharmacy*   Follow-Up: At [facility], you and your health needs are our priority.  As part of our continuing mission to provide you with exceptional heart care, we have created designated Provider Care Teams.  These Care Teams include your primary Cardiologist (physician) and Advanced Practice Providers (APPs -  Physician Assistants and Nurse Practitioners) who all work together to provide you with the care you need, when you need it.     Your next appointment:   1 year(s)  The format for your next appointment:   In Person  Provider:   Dr. Irish Lack

## 2022-04-29 DIAGNOSIS — Z1211 Encounter for screening for malignant neoplasm of colon: Secondary | ICD-10-CM | POA: Diagnosis not present

## 2022-04-29 DIAGNOSIS — K635 Polyp of colon: Secondary | ICD-10-CM | POA: Diagnosis not present

## 2022-04-29 DIAGNOSIS — K648 Other hemorrhoids: Secondary | ICD-10-CM | POA: Diagnosis not present

## 2022-04-29 DIAGNOSIS — K5730 Diverticulosis of large intestine without perforation or abscess without bleeding: Secondary | ICD-10-CM | POA: Diagnosis not present

## 2022-07-10 DIAGNOSIS — M25551 Pain in right hip: Secondary | ICD-10-CM | POA: Diagnosis not present

## 2022-07-10 DIAGNOSIS — M5442 Lumbago with sciatica, left side: Secondary | ICD-10-CM | POA: Diagnosis not present

## 2022-07-10 DIAGNOSIS — I2510 Atherosclerotic heart disease of native coronary artery without angina pectoris: Secondary | ICD-10-CM | POA: Diagnosis not present

## 2022-07-10 DIAGNOSIS — Z87891 Personal history of nicotine dependence: Secondary | ICD-10-CM | POA: Diagnosis not present

## 2022-07-10 DIAGNOSIS — I10 Essential (primary) hypertension: Secondary | ICD-10-CM | POA: Diagnosis not present

## 2022-07-24 DIAGNOSIS — Z0000 Encounter for general adult medical examination without abnormal findings: Secondary | ICD-10-CM | POA: Diagnosis not present

## 2022-07-24 DIAGNOSIS — I10 Essential (primary) hypertension: Secondary | ICD-10-CM | POA: Diagnosis not present

## 2022-07-24 DIAGNOSIS — F329 Major depressive disorder, single episode, unspecified: Secondary | ICD-10-CM | POA: Diagnosis not present

## 2022-07-24 DIAGNOSIS — Z125 Encounter for screening for malignant neoplasm of prostate: Secondary | ICD-10-CM | POA: Diagnosis not present

## 2022-07-24 DIAGNOSIS — E7800 Pure hypercholesterolemia, unspecified: Secondary | ICD-10-CM | POA: Diagnosis not present

## 2022-09-10 DIAGNOSIS — Z136 Encounter for screening for cardiovascular disorders: Secondary | ICD-10-CM | POA: Diagnosis not present

## 2022-10-20 DIAGNOSIS — I10 Essential (primary) hypertension: Secondary | ICD-10-CM | POA: Diagnosis not present

## 2023-05-17 NOTE — Progress Notes (Unsigned)
Cardiology Office Note:   Date:  05/19/2023  ID:  Ricky Brown, DOB 27-Mar-1956, MRN [id number] PCP:  Shon Hale, MD  [facility] Providers Cardiologist:  Alverda Skeans, MD Referring MD: Shon Hale, *  Chief Complaint/Reason for Referral: Cardiology follow-up ASSESSMENT:    1. Coronary artery calcification seen on CAT scan   2. Hyperlipidemia LDL goal <70   3. Primary hypertension   4. BMI 30.0-30.9,adult     PLAN:   In order of problems listed above: Coronary artery disease: Start aspirin 81 mg daily. Hyperlipidemia: Continue atorvastatin 20 mg.  Check lipid panel, LFTs, and LP(a) today.  Goal LDL is less than 70 and may be lower if LP(a) is elevated.   Hypertension: Blood pressure is well-controlled on his current regimen. Elevated BMI: Diet and exercise.             Dispo:  Return in about 6 months (around 11/17/2023).      Medication Adjustments/Labs and Tests Ordered: Current medicines are reviewed at length with the patient today.  Concerns regarding medicines are outlined above.  The following changes have been made:     Labs/tests ordered: Orders Placed This Encounter  Procedures   Lipid Profile   Hepatic function panel   Lipoprotein A (LPA)   EKG 12-Lead    Medication Changes: Meds ordered this encounter  Medications   aspirin EC 81 MG tablet    Sig: Take 1 tablet (81 mg total) by mouth daily. Swallow whole.    Current medicines are reviewed at length with the patient today.  The patient does not have concerns regarding medicines.  I spent 32 minutes reviewing all clinical data during and prior to this visit including all relevant imaging studies, laboratories, clinical information from other health systems, and prior notes from both Cardiology and other specialties, interviewing the patient, and conducting a complete physical examination in order to formulate a comprehensive and personalized evaluation and treatment  plan.  History of Present Illness:      FOCUSED PROBLEM LIST:   Coronary artery calcification CT calcium score 2023 Hypertension BMI of 30 Hyperlipidemia Family history of coronary artery disease History of smoking Abd U/S 2022 negative  October 2024: The patient returns for routine follow-up.  He was last seen 1 year ago for evaluation of elevated coronary artery calcification.  His blood pressure at that visit was well-controlled.  A statin was recommended however the patient deferred this.  However within the last year he was started on atorvastatin and is tolerating this fine.  He denies any chest pain or exertional dyspnea.  He and his wife like to hike quite a bit.  He can go on 9 mile hikes and sometimes develops bilateral leg pain.  This does not happen unless he takes a long distance.  He does have a history of low back issues.  He also reports arm pain that typically happens at rest when he has either his left or right arm in a certain position.  This does not happen with exertion.  He does have a history of shoulder issues.  He denies any presyncope or syncope, palpitations, paroxysmal nocturnal dyspnea, orthopnea.  He did quit smoking about 14 years ago.  He was screened for abdominal aortic aneurysm he tells me about a year and a half ago and this was negative.  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  He and  his wife like to walk and hike as detailed above.          Current Medications: Current Meds  Medication Sig   amLODipine (NORVASC) 5 MG tablet Take 5 mg by mouth daily.   aspirin EC 81 MG tablet Take 1 tablet (81 mg total) by mouth daily. Swallow whole.   atorvastatin (LIPITOR) 20 MG tablet Take by mouth.   citalopram (CELEXA) 40 MG tablet Take 40 mg by mouth daily.   lisinopril (ZESTRIL) 20 MG tablet Take 20 mg by mouth daily.     Review of Systems:   Please see the history of present illness.    All other  systems reviewed and are negative.     EKGs/Labs/Other Test Reviewed:   EKG: EKG performed September 2023 that I reviewed demonstrated normal sinus rhythm  EKG Interpretation Date/Time:  Wednesday May 19 2023 14:47:50 EDT Ventricular Rate:  86 PR Interval:  166 QRS Duration:  86 QT Interval:  368 QTC Calculation: 440 R Axis:   -26  Text Interpretation: Normal sinus rhythm Minimal voltage criteria for LVH, may be normal variant ( R in aVL ) When compared with ECG of 15-Apr-2010 17:57, QRS axis Shifted left Confirmed by Alverda Skeans ([id number]) on 05/19/2023 2:55:26 PM         Risk Assessment/Calculations:          Physical Exam:   VS:  BP 106/78   Pulse 84   Ht 5\' 9"  (1.753 m)   Wt 218 lb 9.6 oz (99.2 kg)   SpO2 97%   BMI 32.28 kg/m        Wt Readings from Last 3 Encounters:  05/19/23 218 lb 9.6 oz (99.2 kg)  04/16/22 213 lb (96.6 kg)      GENERAL:  No apparent distress, AOx3 HEENT:  No carotid bruits, +2 carotid impulses, no scleral icterus CAR: RRR no murmurs, gallops, rubs, or thrills RES:  Clear to auscultation bilaterally ABD:  Soft, nontender, nondistended, positive bowel sounds x 4 VASC:  +2 radial pulses, +2 carotid pulses NEURO:  CN 2-12 grossly intact; motor and sensory grossly intact PSYCH:  No active depression or anxiety EXT:  No edema, ecchymosis, or cyanosis  Signed, Orbie Pyo, MD  05/19/2023 3:11 PM    [facility] [address] [place], [place], [place]  [address] Phone: [phone]; Fax: [phone]   Note:  This document was prepared using Dragon voice recognition software and may include unintentional dictation errors.

## 2023-05-19 ENCOUNTER — Encounter: Payer: Self-pay | Admitting: Internal Medicine

## 2023-05-19 ENCOUNTER — Ambulatory Visit: Payer: Federal, State, Local not specified - PPO | Attending: Internal Medicine | Admitting: Internal Medicine

## 2023-05-19 VITALS — BP 106/78 | HR 84 | Ht 69.0 in | Wt 218.6 lb

## 2023-05-19 DIAGNOSIS — I2510 Atherosclerotic heart disease of native coronary artery without angina pectoris: Secondary | ICD-10-CM | POA: Diagnosis not present

## 2023-05-19 DIAGNOSIS — I10 Essential (primary) hypertension: Secondary | ICD-10-CM

## 2023-05-19 DIAGNOSIS — Z6830 Body mass index (BMI) 30.0-30.9, adult: Secondary | ICD-10-CM | POA: Diagnosis not present

## 2023-05-19 DIAGNOSIS — E785 Hyperlipidemia, unspecified: Secondary | ICD-10-CM

## 2023-05-19 MED ORDER — ASPIRIN 81 MG PO TBEC: 81.0000 mg | DELAYED_RELEASE_TABLET | Freq: Every day | ORAL | Status: AC

## 2023-05-19 NOTE — Patient Instructions (Signed)
Medication Instructions:  Your physician has recommended you make the following change in your medication: Start aspirin 81 mg by mouth daily   *If you need a refill on your cardiac medications before your next appointment, please call your pharmacy*   Lab Work: Lab work to be done today--Lipids, Liver, Lp(a) If you have labs (blood work) drawn today and your tests are completely normal, you will receive your results only by: MyChart Message (if you have MyChart) OR A paper copy in the mail If you have any lab test that is abnormal or we need to change your treatment, we will call you to review the results.   Testing/Procedures: none   Follow-Up: At [facility], you and your health needs are our priority.  As part of our continuing mission to provide you with exceptional heart care, we have created designated Provider Care Teams.  These Care Teams include your primary Cardiologist (physician) and Advanced Practice Providers (APPs -  Physician Assistants and Nurse Practitioners) who all work together to provide you with the care you need, when you need it.  We recommend signing up for the patient portal called "MyChart".  Sign up information is provided on this After Visit Summary.  MyChart is used to connect with patients for Virtual Visits (Telemedicine).  Patients are able to view lab/test results, encounter notes, upcoming appointments, etc.  Non-urgent messages can be sent to your provider as well.   To learn more about what you can do with MyChart, go to [email or web address].    Your next appointment:   6 month(s)  Provider:   Jari Favre, PA-C, Ronie Spies, PA-C, Robin Searing, NP, Jacolyn Reedy, PA-C, Eligha Bridegroom, NP, Tereso Newcomer, PA-C, or Perlie Gold, PA-C         Other Instructions

## 2023-05-21 LAB — HEPATIC FUNCTION PANEL
ALT: 29 [IU]/L (ref 0–44)
AST: 26 [IU]/L (ref 0–40)
Albumin: 4.4 g/dL (ref 3.9–4.9)
Alkaline Phosphatase: 68 [IU]/L (ref 44–121)
Bilirubin Total: 0.7 mg/dL (ref 0.0–1.2)
Bilirubin, Direct: 0.17 mg/dL (ref 0.00–0.40)
Total Protein: 6.6 g/dL (ref 6.0–8.5)

## 2023-05-21 LAB — LIPID PANEL
Chol/HDL Ratio: 4.1 ratio (ref 0.0–5.0)
Cholesterol, Total: 146 mg/dL (ref 100–199)
HDL: 36 mg/dL — ABNORMAL LOW (ref >39)
LDL Chol Calc (NIH): 85 mg/dL (ref 0–99)
Triglycerides: 142 mg/dL (ref 0–149)
VLDL Cholesterol Cal: 25 mg/dL (ref 5–40)

## 2023-05-21 LAB — LIPOPROTEIN A (LPA): Lipoprotein (a): 13.7 nmol/L (ref <75.0)
